# Patient Record
Sex: Male | Born: 1980 | Race: Black or African American | Hispanic: No | Marital: Single | Smoking: Current every day smoker
Health system: Southern US, Community
[De-identification: ages and names within clinical notes are randomized; demographics above are authoritative.]

## PROBLEM LIST (undated history)

## (undated) DIAGNOSIS — Z22322 Carrier or suspected carrier of Methicillin resistant Staphylococcus aureus: Secondary | ICD-10-CM

## (undated) HISTORY — PX: HERNIA REPAIR: SHX51

---

## 2010-09-17 ENCOUNTER — Emergency Department (HOSPITAL_COMMUNITY)
Admission: EM | Admit: 2010-09-17 | Discharge: 2010-09-17 | Disposition: A | Discharge status: Home / Self Care | Payer: Self-pay | Attending: Emergency Medicine | Admitting: Emergency Medicine

## 2010-09-17 ENCOUNTER — Emergency Department (HOSPITAL_COMMUNITY): Payer: Self-pay

## 2010-09-17 DIAGNOSIS — J019 Acute sinusitis, unspecified: Secondary | ICD-10-CM | POA: Insufficient documentation

## 2010-09-17 DIAGNOSIS — R51 Headache: Secondary | ICD-10-CM | POA: Insufficient documentation

## 2017-12-18 ENCOUNTER — Encounter (HOSPITAL_COMMUNITY): Payer: Self-pay | Admitting: Emergency Medicine

## 2017-12-18 ENCOUNTER — Emergency Department (HOSPITAL_COMMUNITY)
Admission: EM | Admit: 2017-12-18 | Discharge: 2017-12-18 | Disposition: A | Discharge status: Home / Self Care | Payer: Self-pay | Attending: Emergency Medicine | Admitting: Emergency Medicine

## 2017-12-18 ENCOUNTER — Other Ambulatory Visit: Payer: Self-pay

## 2017-12-18 DIAGNOSIS — L02412 Cutaneous abscess of left axilla: Secondary | ICD-10-CM | POA: Insufficient documentation

## 2017-12-18 DIAGNOSIS — Z8614 Personal history of Methicillin resistant Staphylococcus aureus infection: Secondary | ICD-10-CM | POA: Insufficient documentation

## 2017-12-18 DIAGNOSIS — F17210 Nicotine dependence, cigarettes, uncomplicated: Secondary | ICD-10-CM | POA: Insufficient documentation

## 2017-12-18 HISTORY — DX: Carrier or suspected carrier of methicillin resistant Staphylococcus aureus: Z22.322

## 2017-12-18 MED ORDER — OXYCODONE-ACETAMINOPHEN 5-325 MG PO TABS
2.0000 | ORAL_TABLET | Freq: Once | ORAL | Status: AC
  Administered 2017-12-18: 2 via ORAL
  Filled 2017-12-18: qty 2

## 2017-12-18 MED ORDER — LIDOCAINE-EPINEPHRINE 1 %-1:200000 IJ SOLN
20.0000 mL | Freq: Once | INTRAMUSCULAR | Status: AC
Start: 2017-12-18 — End: 2017-12-18
  Administered 2017-12-18: 20 mL
  Filled 2017-12-18: qty 30

## 2017-12-18 MED ORDER — DOXYCYCLINE HYCLATE 100 MG PO CAPS: 100.0000 mg | ORAL_CAPSULE | Freq: Two times a day (BID) | ORAL | 0 refills | Status: AC

## 2017-12-18 NOTE — ED Triage Notes (Signed)
Pt c/o abscess to the left axilla x 2 weeks.

## 2017-12-18 NOTE — ED Provider Notes (Signed)
[facility] EMERGENCY DEPARTMENT Provider Note   CSN: [id number] Arrival date & time: 12/18/17  [id number]     History   Chief Complaint Chief Complaint  Patient presents with  . Abscess    HPI Douglas Butler is a 37 y.o. male.  Patient with abscess to his left axilla for the past 2 weeks.  Denies any bleeding or drainage.  Has had abscesses requiring drainage in the past.  Is not a diabetic to his knowledge.  No fever.  No chills, nausea or vomiting.  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  The history is provided by the patient.  Abscess  Associated symptoms: no fever, no headaches, no nausea and no vomiting     Past Medical History:  Diagnosis Date  . MRSA (methicillin resistant staph aureus) culture positive     There are no active problems to display for this patient.   Past Surgical History:  Procedure Laterality Date  . HERNIA REPAIR          Home Medications    Prior to Admission medications   Not on File    Family History No family history on file.  Social History Social History   Tobacco Use  . Smoking status: Current Every Day Smoker  . Smokeless tobacco: Never Used  Substance Use Topics  . Alcohol use: Yes  . Drug use: Never     Allergies   Patient has no allergy information on record.   Review of Systems Review of Systems  Constitutional: Negative for activity change, appetite change and fever.  HENT: Negative for congestion and rhinorrhea.   Eyes: Negative for visual disturbance.  Respiratory: Negative for cough, chest tightness and shortness of breath.   Cardiovascular: Negative for chest pain.  Gastrointestinal: Negative for abdominal pain, nausea and vomiting.  Genitourinary: Negative for dysuria and hematuria.  Musculoskeletal: Negative for arthralgias and myalgias.  Skin: Positive for wound.  Neurological: Negative for dizziness, weakness, numbness and headaches.    all other systems are negative  except as noted in the HPI and PMH.    Physical Exam Updated Vital Signs BP 117/85 (BP Location: Right Arm)   Pulse 71   Temp 98.8 F (37.1 C) (Oral)   Resp 18   Ht 5\' 9"  (1.753 m)   Wt 104.3 kg (230 lb)   SpO2 98%   BMI 33.97 kg/m   Physical Exam  Constitutional: He is oriented to person, place, and time. He appears well-developed and well-nourished. No distress.  HENT:  Head: Normocephalic and atraumatic.  Mouth/Throat: Oropharynx is clear and moist. No oropharyngeal exudate.  Eyes: Pupils are equal, round, and reactive to light. Conjunctivae and EOM are normal.  Neck: Normal range of motion. Neck supple.  No meningismus.  Cardiovascular: Normal rate, regular rhythm, normal heart sounds and intact distal pulses.  No murmur heard. Pulmonary/Chest: Effort normal and breath sounds normal. No respiratory distress.  Abdominal: Soft. There is no tenderness. There is no rebound and no guarding.  Musculoskeletal: Normal range of motion. He exhibits tenderness. He exhibits no edema.  Neurological: He is alert and oriented to person, place, and time. No cranial nerve deficit. He exhibits normal muscle tone. Coordination normal.  No ataxia on finger to nose bilaterally. No pronator drift. 5/5 strength throughout. CN 2-12 intact.Equal grip strength. Sensation intact.   Skin: Skin is warm.  Left axilla has 2 cm x 4 cm area of induration with central fluctuance and tenderness No significant erythema  Psychiatric: He has a normal mood and affect. His behavior is normal.  Nursing note and vitals reviewed.    ED Treatments / Results  Labs (all labs ordered are listed, but only abnormal results are displayed) Labs Reviewed - No data to display  EKG None  Radiology No results found.  Procedures .Marland KitchenIncision and Drainage Date/Time: 12/18/2017 7:19 AM Performed by: Glynn Octave, MD Authorized by: Glynn Octave, MD   Consent:    Consent obtained:  Verbal   Consent given by:   Patient   Risks discussed:  Incomplete drainage, bleeding, infection and pain Location:    Type:  Abscess   Size:  4   Location:  Upper extremity   Upper extremity location: axilla. Pre-procedure details:    Skin preparation:  Betadine Anesthesia (see MAR for exact dosages):    Anesthesia method:  Local infiltration   Local anesthetic:  Lidocaine 1% w/o epi Procedure type:    Complexity:  Complex Procedure details:    Incision types:  Single straight   Incision depth:  Subcutaneous   Scalpel blade:  11   Wound management:  Probed and deloculated, irrigated with saline and extensive cleaning   Drainage:  Purulent   Drainage amount:  Copious   Wound treatment:  Drain placed   Packing materials:  1/4 in iodoform gauze Post-procedure details:    Patient tolerance of procedure:  Tolerated well, no immediate complications   (including critical care time)  Medications Ordered in ED Medications  lidocaine-EPINEPHrine (XYLOCAINE-EPINEPHrine) 1 %-1:200000 (PF) injection 20 mL (has no administration in time range)  oxyCODONE-acetaminophen (PERCOCET/ROXICET) 5-325 MG per tablet 2 tablet (2 tablets Oral Given 12/18/17 0542)     Initial Impression / Assessment and Plan / ED Course  I have reviewed the triage vital signs and the nursing notes.  Pertinent labs & imaging results that were available during my care of the patient were reviewed by me and considered in my medical decision making (see chart for details).    Axillary abscess.  Hemodynamic is stable.  Tetanus is up-to-date.  Will perform bedside incision and drainage.  Incision and drainage performed as above.  Wound packed loosely.  Patient was given antibiotics. Follow up with PCP for wound check in 2 days.  Discussed can remove packing in 2 days if still present.  Discussed warm soaks and local wound care.  Return precautions discussed. Final Clinical Impressions(s) / ED Diagnoses   Final diagnoses:  Abscess of left  axilla    ED Discharge Orders    None       Trinitee Horgan, Jeannett Senior, MD 12/18/17 [phone]

## 2017-12-18 NOTE — Discharge Instructions (Addendum)
Take the antibiotics and perform the warm soaks as we discussed.  Follow-up in the ED or with the primary doctor in 2 days for a wound check if the packing is still present you may pull it out at this time.  Return to the ED with worsening pain, fever, drainage or any other concerns.

## 2021-03-15 ENCOUNTER — Other Ambulatory Visit: Payer: Self-pay

## 2021-03-15 ENCOUNTER — Emergency Department (HOSPITAL_COMMUNITY)
Admission: EM | Admit: 2021-03-15 | Discharge: 2021-03-15 | Disposition: A | Discharge status: Home / Self Care | Payer: 59 | Attending: Emergency Medicine | Admitting: Emergency Medicine

## 2021-03-15 ENCOUNTER — Encounter (HOSPITAL_COMMUNITY): Payer: Self-pay | Admitting: Emergency Medicine

## 2021-03-15 ENCOUNTER — Emergency Department (HOSPITAL_COMMUNITY): Payer: 59

## 2021-03-15 DIAGNOSIS — Y92410 Unspecified street and highway as the place of occurrence of the external cause: Secondary | ICD-10-CM | POA: Insufficient documentation

## 2021-03-15 DIAGNOSIS — S39012A Strain of muscle, fascia and tendon of lower back, initial encounter: Secondary | ICD-10-CM | POA: Insufficient documentation

## 2021-03-15 DIAGNOSIS — S53402A Unspecified sprain of left elbow, initial encounter: Secondary | ICD-10-CM | POA: Insufficient documentation

## 2021-03-15 DIAGNOSIS — F17200 Nicotine dependence, unspecified, uncomplicated: Secondary | ICD-10-CM | POA: Insufficient documentation

## 2021-03-15 DIAGNOSIS — R519 Headache, unspecified: Secondary | ICD-10-CM | POA: Insufficient documentation

## 2021-03-15 DIAGNOSIS — S46912A Strain of unspecified muscle, fascia and tendon at shoulder and upper arm level, left arm, initial encounter: Secondary | ICD-10-CM

## 2021-03-15 DIAGNOSIS — S3992XA Unspecified injury of lower back, initial encounter: Secondary | ICD-10-CM | POA: Diagnosis present
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
## 2021-03-15 NOTE — ED Notes (Signed)
Pt was in CT when called
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
## 2021-03-15 NOTE — Discharge Instructions (Signed)
Recommend Tylenol or anti-inflammatory such as Motrin as needed for pain control.  Come back to ER if you develop chest pain, abdominal pain, difficulty breathing or other new concerning symptom.

## 2022-08-21 ENCOUNTER — Ambulatory Visit: Payer: 59 | Admitting: Family Medicine
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
